# Patient Record
Sex: Female | Born: 1975 | Race: Black or African American | Hispanic: No | Marital: Single | Smoking: Never smoker
Health system: Southern US, Community
[De-identification: ages and names within clinical notes are randomized; demographics above are authoritative.]

---

## 2016-07-01 ENCOUNTER — Emergency Department (HOSPITAL_COMMUNITY)
Admission: EM | Admit: 2016-07-01 | Discharge: 2016-07-01 | Disposition: A | Discharge status: Home / Self Care | Payer: Medicaid Other | Attending: Emergency Medicine | Admitting: Emergency Medicine

## 2016-07-01 ENCOUNTER — Encounter (HOSPITAL_COMMUNITY): Payer: Self-pay | Admitting: Emergency Medicine

## 2016-07-01 ENCOUNTER — Emergency Department (HOSPITAL_COMMUNITY): Payer: Medicaid Other

## 2016-07-01 DIAGNOSIS — Y999 Unspecified external cause status: Secondary | ICD-10-CM | POA: Diagnosis not present

## 2016-07-01 DIAGNOSIS — Y929 Unspecified place or not applicable: Secondary | ICD-10-CM | POA: Diagnosis not present

## 2016-07-01 DIAGNOSIS — S60221A Contusion of right hand, initial encounter: Secondary | ICD-10-CM

## 2016-07-01 DIAGNOSIS — Y93E5 Activity, floor mopping and cleaning: Secondary | ICD-10-CM | POA: Diagnosis not present

## 2016-07-01 DIAGNOSIS — W010XXA Fall on same level from slipping, tripping and stumbling without subsequent striking against object, initial encounter: Secondary | ICD-10-CM | POA: Diagnosis not present

## 2016-07-01 DIAGNOSIS — S6991XA Unspecified injury of right wrist, hand and finger(s), initial encounter: Secondary | ICD-10-CM | POA: Diagnosis present

## 2016-07-01 MED ORDER — NAPROXEN 500 MG PO TABS: 500.0000 mg | ORAL_TABLET | Freq: Two times a day (BID) | ORAL | 0 refills | Status: AC

## 2016-07-01 MED ORDER — HYDROCODONE-ACETAMINOPHEN 5-325 MG PO TABS
1.0000 | ORAL_TABLET | Freq: Once | ORAL | Status: AC
  Administered 2016-07-01: 1 via ORAL
  Filled 2016-07-01: qty 1

## 2016-07-01 NOTE — ED Notes (Signed)
Patient at xray

## 2016-07-01 NOTE — ED Triage Notes (Signed)
Pt tripped while moping and tried to catch herself with right hand. Pt has swelling to right middle finger knuckle. Radial pulses +2 equal. Movement and sensation intact.

## 2016-07-01 NOTE — ED Provider Notes (Signed)
[facility]-EMERGENCY DEPT Provider Note   CSN: [id number] Arrival date & time: 07/01/16  1236  By signing my name below, I, Robin Short, attest that this documentation has been prepared under the direction and in the presence of Josh Coley Kulikowski PA-C . Electronically Signed: Majel HomerPeyton Short, Scribe. 07/01/2016. 1:57 PM.  History   Chief Complaint Chief Complaint  Patient presents with  . Hand Injury   The history is provided by the patient. No language interpreter was used.   HPI Comments: Robin Short is a 41 y.o. female who presents to the Emergency Department complaining of gradually worsening, right hand pain s/p a mechanical fall that occurred 2 days ago. Pt reports she was mopping the floor 2 days ago when she suddenly slipped and fell, causing her to land on her bent right hand that she used to catch her fall. She states her pain is centralized to her right middle digit with associated swelling. She notes her pain is exacerbated with bending and movement of her finger. She states she has applied ice to her finger with mild relief but reports she has not taken any medication for her pain. Pt denies any other complaints.   History reviewed. No pertinent past medical history.  There are no active problems to display for this patient.  History reviewed. No pertinent surgical history.  OB History    No data available     Home Medications    Prior to Admission medications   Not on File    Family History No family history on file.  Social History Social History  Substance Use Topics  . Smoking status: Not on file  . Smokeless tobacco: Not on file  . Alcohol use Not on file   Allergies   Patient has no known allergies.  Review of Systems Review of Systems  Constitutional: Negative for activity change and fever.  Musculoskeletal: Positive for arthralgias and joint swelling. Negative for back pain and neck pain.  Skin: Negative for wound.  Neurological: Negative for weakness and  numbness.   Physical Exam Updated Vital Signs BP 123/81 (BP Location: Right Arm)   Pulse 74   Temp 98.8 F (37.1 C) (Oral)   Resp 16   Ht 5\' 6"  (1.676 m)   Wt 200 lb (90.7 kg)   LMP 06/17/2016   SpO2 100%   BMI 32.28 kg/m   Physical Exam  Constitutional: She is oriented to person, place, and time. She appears well-developed and well-nourished.  HENT:  Head: Normocephalic and atraumatic.  Eyes: EOM are normal. Pupils are equal, round, and reactive to light.  Neck: Normal range of motion. Neck supple.  Cardiovascular: Normal pulses.  Exam reveals no decreased pulses.   Pulmonary/Chest: Effort normal.  Abdominal: She exhibits no distension.  Musculoskeletal: She exhibits tenderness. She exhibits no edema.       Right elbow: Normal.      Right wrist: Normal.       Right forearm: Normal.       Right hand: She exhibits decreased range of motion and tenderness. She exhibits no bony tenderness, normal capillary refill and no deformity.       Hands: Neurological: She is alert and oriented to person, place, and time. No sensory deficit.  Motor, sensation, and vascular distal to the injury is fully intact.   Skin: Skin is warm and dry.  Psychiatric: She has a normal mood and affect.  Nursing note and vitals reviewed.  ED Treatments / Results  DIAGNOSTIC STUDIES:  Oxygen Saturation is 100% on RA, normal by my interpretation.    COORDINATION OF CARE:  1:48 PM Discussed treatment plan with pt at bedside which includes X-ray of right hand and pt agreed to plan.  Radiology Dg Hand Complete Right  Result Date: 07/01/2016 CLINICAL DATA:  42 y/o F; fall with hand injury and pain around the third metacarpophalangeal joint. EXAM: RIGHT HAND - COMPLETE 3+ VIEW COMPARISON:  None. FINDINGS: There is no evidence of fracture or dislocation. There is no evidence of arthropathy or other focal bone abnormality. Soft tissues are unremarkable. IMPRESSION: Negative. Electronically Signed   By: Mitzi Hansen M.D.   On: 07/01/2016 14:15   Procedures Procedures (including critical care time)  Medications Ordered in ED Medications  HYDROcodone-acetaminophen (NORCO/VICODIN) 5-325 MG per tablet 1 tablet (1 tablet Oral Given 07/01/16 1419)    Initial Impression / Assessment and Plan / ED Course  I have reviewed the triage vital signs and the nursing notes.  Pertinent labs & imaging results that were available during my care of the patient were reviewed by me and considered in my medical decision making (see chart for details).     Patient X-Ray negative for obvious fracture or dislocation.  Pt advised to follow up with orthopedics. Patient given splint while in ED, conservative therapy recommended and discussed. Patient will be discharged home & is agreeable with above plan. Returns precautions discussed. Pt appears safe for discharge.  I personally performed the services described in this documentation, which was scribed in my presence. The recorded information has been reviewed and is accurate.   Final Clinical Impressions(s) / ED Diagnoses   Final diagnoses:  Contusion of right hand, initial encounter   Patient with right hand injury. X-ray negative. Will treat as contusion.  New Prescriptions New Prescriptions   NAPROXEN (NAPROSYN) 500 MG TABLET    Take 1 tablet (500 mg total) by mouth 2 (two) times daily.     Renne Crigler, PA-C 07/01/16 1449    Geoffery Lyons, MD 07/01/16 Jerene Bears

## 2016-07-01 NOTE — Progress Notes (Signed)
Orthopedic Tech Progress Note Patient Details:  Robin Short 03/30/1976 [id number]  Ortho Devices Type of Ortho Device: Finger splint Ortho Device/Splint Location: rue Ortho Device/Splint Interventions: Application   [name], [name] 07/01/2016, 3:02 PM

## 2016-07-01 NOTE — Discharge Instructions (Signed)
Please read and follow all provided instructions.  Your diagnoses today include:  1. Contusion of right hand, initial encounter    Tests performed today include:  An x-ray of the affected area - does NOT show any broken bones  Vital signs. See below for your results today.   Medications prescribed:   Naproxen - anti-inflammatory pain medication  Do not exceed 500mg  naproxen every 12 hours, take with food  You have been prescribed an anti-inflammatory medication or NSAID. Take with food. Take smallest effective dose for the shortest duration needed for your pain. Stop taking if you experience stomach pain or vomiting.   Take any prescribed medications only as directed.  Home care instructions:   Follow any educational materials contained in this packet  Follow R.I.C.E. Protocol:  R - rest your injury   I  - use ice on injury without applying directly to skin  C - compress injury with bandage or splint  E - elevate the injury as much as possible  Follow-up instructions: Please follow-up with your primary care provider if you continue to have significant pain in 1 week. In this case you may have a more severe injury that requires further care.   Return instructions:   Please return if your fingers are numb or tingling, appear gray or blue, or you have severe pain (also elevate the arm and loosen splint or wrap if you were given one)  Please return to the Emergency Department if you experience worsening symptoms.   Please return if you have any other emergent concerns.  Additional Information:  Your vital signs today were: BP 123/81 (BP Location: Right Arm)    Pulse 74    Temp 98.8 F (37.1 C) (Oral)    Resp 16    Ht 5\' 6"  (1.676 m)    Wt 90.7 kg    LMP 06/17/2016 Comment: tubes tied   SpO2 100%    BMI 32.28 kg/m  If your blood pressure (BP) was elevated above 135/85 this visit, please have this repeated by your doctor within one month. --------------

## 2017-03-17 ENCOUNTER — Encounter (HOSPITAL_COMMUNITY): Payer: Self-pay | Admitting: Emergency Medicine

## 2017-03-17 ENCOUNTER — Other Ambulatory Visit: Payer: Self-pay

## 2017-03-17 ENCOUNTER — Emergency Department (HOSPITAL_COMMUNITY)
Admission: EM | Admit: 2017-03-17 | Discharge: 2017-03-17 | Disposition: A | Discharge status: Home / Self Care | Payer: Medicaid Other | Attending: Emergency Medicine | Admitting: Emergency Medicine

## 2017-03-17 DIAGNOSIS — J0110 Acute frontal sinusitis, unspecified: Secondary | ICD-10-CM | POA: Diagnosis not present

## 2017-03-17 DIAGNOSIS — Z79899 Other long term (current) drug therapy: Secondary | ICD-10-CM | POA: Diagnosis not present

## 2017-03-17 DIAGNOSIS — R0981 Nasal congestion: Secondary | ICD-10-CM | POA: Diagnosis present

## 2017-03-17 MED ORDER — AMOXICILLIN-POT CLAVULANATE 875-125 MG PO TABS: 1.0000 | ORAL_TABLET | Freq: Two times a day (BID) | ORAL | 0 refills | Status: AC

## 2017-03-17 MED ORDER — AMOXICILLIN-POT CLAVULANATE 875-125 MG PO TABS
1.0000 | ORAL_TABLET | Freq: Once | ORAL | Status: AC
  Administered 2017-03-17: 1 via ORAL
  Filled 2017-03-17: qty 1

## 2017-03-17 MED ORDER — IBUPROFEN 800 MG PO TABS
800.0000 mg | ORAL_TABLET | Freq: Once | ORAL | Status: AC
  Administered 2017-03-17: 800 mg via ORAL
  Filled 2017-03-17: qty 1

## 2017-03-17 NOTE — Discharge Instructions (Signed)
Take the prescribed medication as directed.  May wish to try an over the counter decongestant such as mucinex, sudafed, etc. Follow-up with your primary care doctor if any ongoing issues. Return to the ED for new or worsening symptoms.

## 2017-03-17 NOTE — ED Triage Notes (Signed)
Patient reports left eye pain with pressure  and sinus pressure onset last week .

## 2017-03-17 NOTE — ED Provider Notes (Signed)
[facility] Provider Note   CSN: [id number] Arrival date & time: 03/17/17  0256     History   Chief Complaint Chief Complaint  Patient presents with  . Sinus Pressure  . Left Eye Pressure    HPI Robin Short is a 41 y.o. female.  The history is provided by the patient and medical records.    41 year old female here with left sided facial pressure, nasal congestion, and sore throat.  States the sore throat has overall improved, but facial pressure has worsening.  States now she feels pressure behind her left eye.  Denies any visual changes.  Reports she has some pain in the left ear as well.  No fever or chills.  States she works at [employer] and has had some sick contacts.  She also reports she works at [employer] a lot and has been exposed to the cold air.  She did not try any medications for her symptoms prior to arrival.  She denies any chest pain or shortness of breath.  History reviewed. No pertinent past medical history.  There are no active problems to display for this patient.   History reviewed. No pertinent surgical history.  OB History    No data available       Home Medications    Prior to Admission medications   Medication Sig Start Date End Date Taking? Authorizing Provider  naproxen (NAPROSYN) 500 MG tablet Take 1 tablet (500 mg total) by mouth 2 (two) times daily. 07/01/16   Renne CriglerGeiple, Joshua, PA-C    Family History No family history on file.  Social History Social History   Tobacco Use  . Smoking status: Never Smoker  . Smokeless tobacco: Never Used  Substance Use Topics  . Alcohol use: No    Frequency: Never  . Drug use: No     Allergies   Patient has no known allergies.   Review of Systems Review of Systems  HENT: Positive for congestion, ear pain, sinus pressure and sinus pain.   All other systems reviewed and are negative.    Physical Exam Updated Vital Signs BP 131/78  (BP Location: Right Arm)   Pulse 92   Temp 98.3 F (36.8 C) (Oral)   Resp 18   Ht 5\' 6"  (1.676 m)   Wt 106.6 kg (235 lb)   LMP 03/04/2017   SpO2 98%   BMI 37.93 kg/m   Physical Exam  Constitutional: She is oriented to person, place, and time. She appears well-developed and well-nourished.  HENT:  Head: Normocephalic and atraumatic.  Right Ear: Tympanic membrane and ear canal normal.  Left Ear: Tympanic membrane and ear canal normal.  Nose: Mucosal edema and sinus tenderness present. Left sinus exhibits maxillary sinus tenderness and frontal sinus tenderness.  Mouth/Throat: Uvula is midline, oropharynx is clear and moist and mucous membranes are normal. No oropharyngeal exudate, posterior oropharyngeal edema, posterior oropharyngeal erythema or tonsillar abscesses.  + left sided sinus pressure with PND  Eyes: Conjunctivae and EOM are normal. Pupils are equal, round, and reactive to light.  Neck: Normal range of motion.  Cardiovascular: Normal rate, regular rhythm and normal heart sounds.  Pulmonary/Chest: Effort normal and breath sounds normal. No stridor. No respiratory distress.  Abdominal: Soft. Bowel sounds are normal. There is no tenderness. There is no rebound.  Musculoskeletal: Normal range of motion.  Neurological: She is alert and oriented to person, place, and time.  Skin: Skin is warm and dry.  Psychiatric: She has a normal mood and affect.  Nursing note and vitals reviewed.    ED Treatments / Results  Labs (all labs ordered are listed, but only abnormal results are displayed) Labs Reviewed - No data to display  EKG  EKG Interpretation None       Radiology No results found.  Procedures Procedures (including critical care time)  Medications Ordered in ED Medications  ibuprofen (ADVIL,MOTRIN) tablet 800 mg (not administered)  amoxicillin-clavulanate (AUGMENTIN) 875-125 MG per tablet 1 tablet (1 tablet Oral Given 03/17/17 0539)     Initial Impression  / Assessment and Plan / ED Course  I have reviewed the triage vital signs and the nursing notes.  Pertinent labs & imaging results that were available during my care of the patient were reviewed by me and considered in my medical decision making (see chart for details).  41 year old female here with 1 week of URI type symptoms, now with worsening facial pressure, left ear pain, sore throat.  She is afebrile and nontoxic.  Does have left-sided frontal and maxillary sinus pressure on exam.  Some postnasal drip.  Lungs are clear without wheezes or rhonchi.  Vitals are stable.  Suspect sinusitis.  Will start on Augmentin.  Can follow-up with PCP.  Discussed plan with patient, she acknowledged understanding and agreed with plan of care.  Return precautions given for new or worsening symptoms.  Final Clinical Impressions(s) / ED Diagnoses   Final diagnoses:  Acute non-recurrent frontal sinusitis    ED Discharge Orders        Ordered    amoxicillin-clavulanate (AUGMENTIN) 875-125 MG tablet  Every 12 hours     03/17/17 0554       Garlon HatchetSanders, Jerimy Johanson M, PA-C 03/17/17 [id number]    Devoria AlbeKnapp, Iva, MD 03/17/17 [phone]

## 2018-10-25 IMAGING — DX DG HAND COMPLETE 3+V*R*
3 series · 3 of 3 positions shown · non-contrast
Comparison: None.

CLINICAL DATA: 40 y/o F; fall with hand injury and pain around the
third metacarpophalangeal joint.

EXAM:
RIGHT HAND - COMPLETE 3+ VIEW

[hand pa]
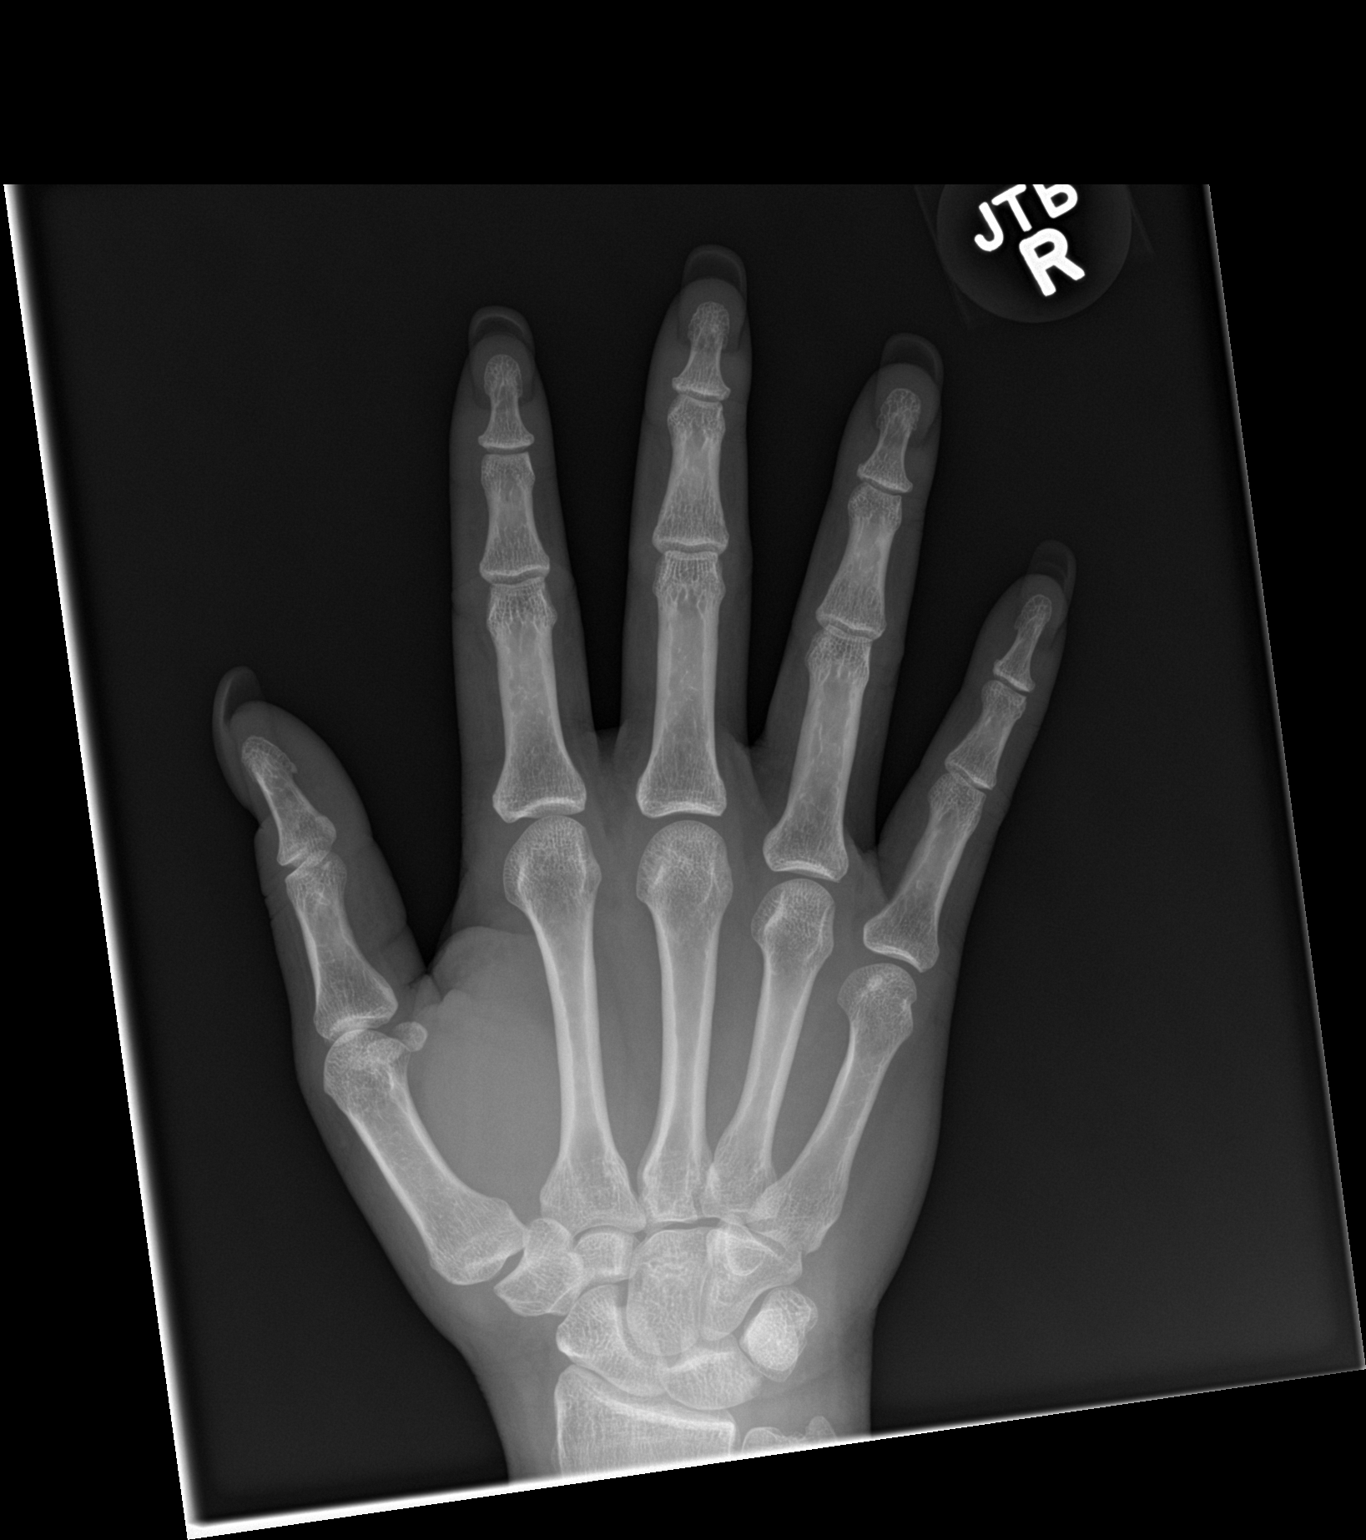

[hand obl]
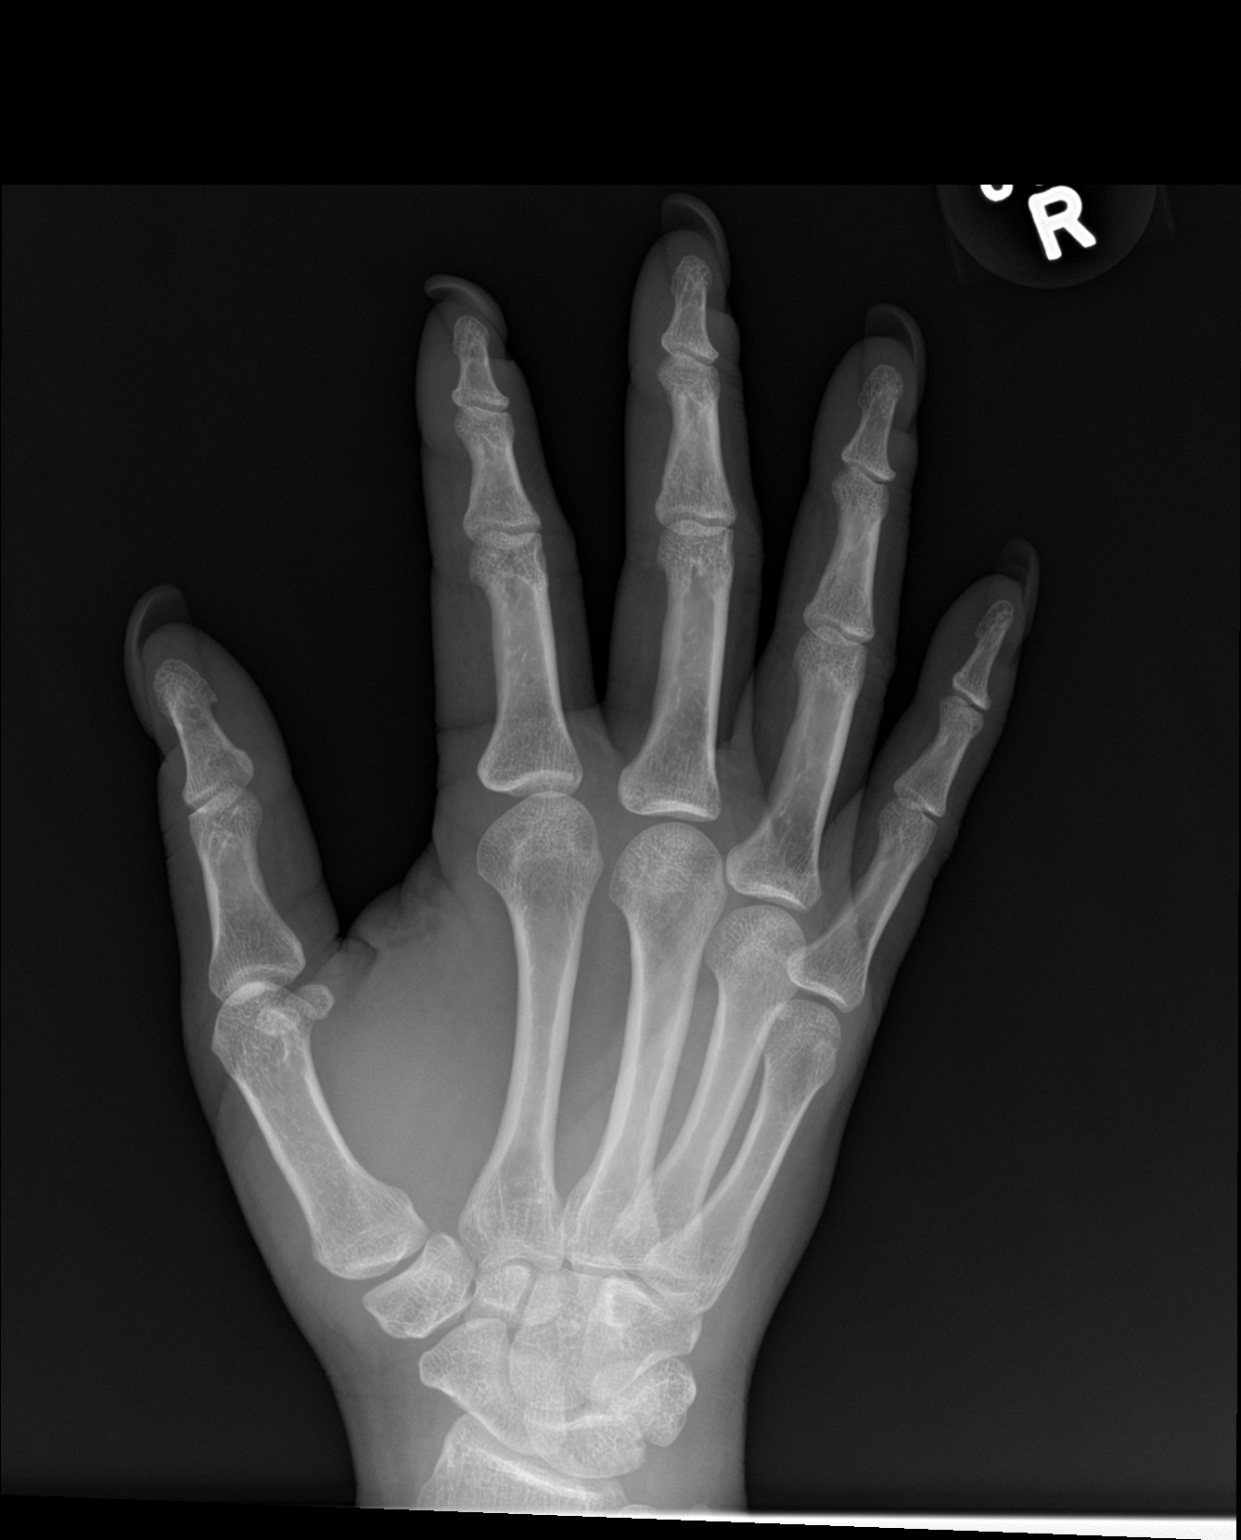

[hand lat]
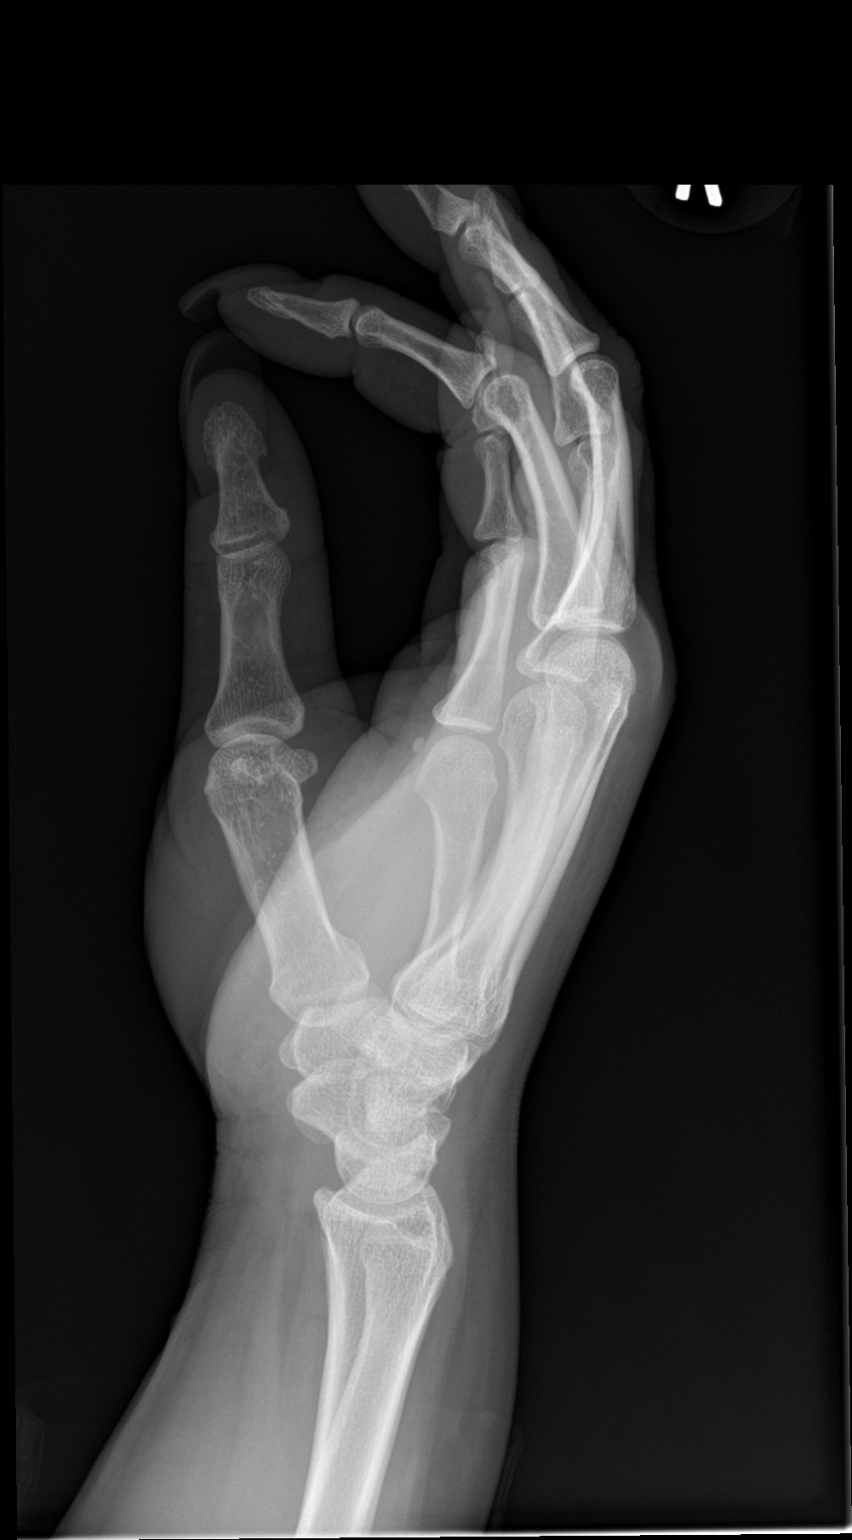

[3 of 3 positions shown; findings below may reference images not displayed]

FINDINGS: There is no evidence of fracture or dislocation. There is no
evidence of arthropathy or other focal bone abnormality. Soft
tissues are unremarkable.
IMPRESSION: Negative.

By: Fatmeh Tabb M.D.
